# Patient Record
Sex: Male | Born: 1995 | Race: White | Hispanic: No | Marital: Single | Smoking: Never smoker
Health system: Southern US, Community
[De-identification: ages and names within clinical notes are randomized; demographics above are authoritative.]

## PROBLEM LIST (undated history)

## (undated) HISTORY — PX: CIRCUMCISION: SUR203

---
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
## 2016-01-06 DIAGNOSIS — R51 Headache: Secondary | ICD-10-CM | POA: Diagnosis not present

## 2016-01-06 DIAGNOSIS — G44221 Chronic tension-type headache, intractable: Secondary | ICD-10-CM | POA: Diagnosis not present

## 2016-01-08 ENCOUNTER — Other Ambulatory Visit: Payer: Self-pay | Admitting: Neurology

## 2016-01-08 DIAGNOSIS — R51 Headache: Principal | ICD-10-CM

## 2016-01-08 DIAGNOSIS — R519 Headache, unspecified: Secondary | ICD-10-CM

## 2016-01-14 ENCOUNTER — Ambulatory Visit
Admission: RE | Admit: 2016-01-14 | Discharge: 2016-01-14 | Disposition: A | Discharge status: Home / Self Care | Payer: BLUE CROSS/BLUE SHIELD | Source: Ambulatory Visit | Attending: Neurology | Admitting: Neurology

## 2016-01-14 DIAGNOSIS — R519 Headache, unspecified: Secondary | ICD-10-CM

## 2016-01-14 DIAGNOSIS — R51 Headache: Secondary | ICD-10-CM | POA: Diagnosis not present

## 2016-02-12 DIAGNOSIS — R51 Headache: Secondary | ICD-10-CM | POA: Diagnosis not present

## 2016-02-20 DIAGNOSIS — G44221 Chronic tension-type headache, intractable: Secondary | ICD-10-CM | POA: Diagnosis not present

## 2016-03-11 ENCOUNTER — Ambulatory Visit
Admission: RE | Admit: 2016-03-11 | Discharge: 2016-03-11 | Disposition: A | Discharge status: Home / Self Care | Payer: BLUE CROSS/BLUE SHIELD | Source: Ambulatory Visit | Attending: Neurology | Admitting: Neurology

## 2016-03-11 ENCOUNTER — Other Ambulatory Visit: Payer: Self-pay | Admitting: Neurology

## 2016-03-11 DIAGNOSIS — M542 Cervicalgia: Secondary | ICD-10-CM | POA: Diagnosis not present

## 2016-03-16 DIAGNOSIS — M542 Cervicalgia: Secondary | ICD-10-CM | POA: Diagnosis not present

## 2016-03-29 DIAGNOSIS — M542 Cervicalgia: Secondary | ICD-10-CM | POA: Diagnosis not present

## 2016-04-05 DIAGNOSIS — M542 Cervicalgia: Secondary | ICD-10-CM | POA: Diagnosis not present

## 2016-04-14 DIAGNOSIS — M542 Cervicalgia: Secondary | ICD-10-CM | POA: Diagnosis not present

## 2016-04-15 DIAGNOSIS — Z23 Encounter for immunization: Secondary | ICD-10-CM | POA: Diagnosis not present

## 2016-04-15 DIAGNOSIS — Z0000 Encounter for general adult medical examination without abnormal findings: Secondary | ICD-10-CM | POA: Diagnosis not present

## 2016-04-16 DIAGNOSIS — M542 Cervicalgia: Secondary | ICD-10-CM | POA: Diagnosis not present

## 2016-04-19 DIAGNOSIS — M542 Cervicalgia: Secondary | ICD-10-CM | POA: Diagnosis not present

## 2016-04-19 DIAGNOSIS — Z111 Encounter for screening for respiratory tuberculosis: Secondary | ICD-10-CM | POA: Diagnosis not present

## 2016-04-22 DIAGNOSIS — M542 Cervicalgia: Secondary | ICD-10-CM | POA: Diagnosis not present

## 2016-04-28 DIAGNOSIS — M542 Cervicalgia: Secondary | ICD-10-CM | POA: Diagnosis not present

## 2016-04-30 DIAGNOSIS — M542 Cervicalgia: Secondary | ICD-10-CM | POA: Diagnosis not present

## 2016-05-03 DIAGNOSIS — M542 Cervicalgia: Secondary | ICD-10-CM | POA: Diagnosis not present

## 2016-05-03 DIAGNOSIS — M9901 Segmental and somatic dysfunction of cervical region: Secondary | ICD-10-CM | POA: Diagnosis not present

## 2016-05-03 DIAGNOSIS — M9905 Segmental and somatic dysfunction of pelvic region: Secondary | ICD-10-CM | POA: Diagnosis not present

## 2016-05-03 DIAGNOSIS — M791 Myalgia: Secondary | ICD-10-CM | POA: Diagnosis not present

## 2016-05-03 DIAGNOSIS — M9902 Segmental and somatic dysfunction of thoracic region: Secondary | ICD-10-CM | POA: Diagnosis not present

## 2016-05-05 DIAGNOSIS — M9903 Segmental and somatic dysfunction of lumbar region: Secondary | ICD-10-CM | POA: Diagnosis not present

## 2016-05-05 DIAGNOSIS — M9905 Segmental and somatic dysfunction of pelvic region: Secondary | ICD-10-CM | POA: Diagnosis not present

## 2016-05-05 DIAGNOSIS — M791 Myalgia: Secondary | ICD-10-CM | POA: Diagnosis not present

## 2016-05-06 DIAGNOSIS — G44221 Chronic tension-type headache, intractable: Secondary | ICD-10-CM | POA: Diagnosis not present

## 2016-05-06 DIAGNOSIS — G4489 Other headache syndrome: Secondary | ICD-10-CM | POA: Diagnosis not present

## 2016-05-06 DIAGNOSIS — M542 Cervicalgia: Secondary | ICD-10-CM | POA: Diagnosis not present

## 2016-05-08 DIAGNOSIS — M9901 Segmental and somatic dysfunction of cervical region: Secondary | ICD-10-CM | POA: Diagnosis not present

## 2016-05-08 DIAGNOSIS — M9902 Segmental and somatic dysfunction of thoracic region: Secondary | ICD-10-CM | POA: Diagnosis not present

## 2016-05-08 DIAGNOSIS — M9905 Segmental and somatic dysfunction of pelvic region: Secondary | ICD-10-CM | POA: Diagnosis not present

## 2016-05-08 DIAGNOSIS — M9903 Segmental and somatic dysfunction of lumbar region: Secondary | ICD-10-CM | POA: Diagnosis not present

## 2016-05-10 DIAGNOSIS — M9902 Segmental and somatic dysfunction of thoracic region: Secondary | ICD-10-CM | POA: Diagnosis not present

## 2016-05-10 DIAGNOSIS — M9901 Segmental and somatic dysfunction of cervical region: Secondary | ICD-10-CM | POA: Diagnosis not present

## 2016-05-10 DIAGNOSIS — M9903 Segmental and somatic dysfunction of lumbar region: Secondary | ICD-10-CM | POA: Diagnosis not present

## 2016-05-11 DIAGNOSIS — M542 Cervicalgia: Secondary | ICD-10-CM | POA: Diagnosis not present

## 2016-05-12 DIAGNOSIS — M9905 Segmental and somatic dysfunction of pelvic region: Secondary | ICD-10-CM | POA: Diagnosis not present

## 2016-05-12 DIAGNOSIS — M9901 Segmental and somatic dysfunction of cervical region: Secondary | ICD-10-CM | POA: Diagnosis not present

## 2016-05-12 DIAGNOSIS — M9903 Segmental and somatic dysfunction of lumbar region: Secondary | ICD-10-CM | POA: Diagnosis not present

## 2016-05-12 DIAGNOSIS — M9902 Segmental and somatic dysfunction of thoracic region: Secondary | ICD-10-CM | POA: Diagnosis not present

## 2016-05-31 DIAGNOSIS — M9905 Segmental and somatic dysfunction of pelvic region: Secondary | ICD-10-CM | POA: Diagnosis not present

## 2016-05-31 DIAGNOSIS — M9901 Segmental and somatic dysfunction of cervical region: Secondary | ICD-10-CM | POA: Diagnosis not present

## 2016-05-31 DIAGNOSIS — M9902 Segmental and somatic dysfunction of thoracic region: Secondary | ICD-10-CM | POA: Diagnosis not present

## 2016-05-31 DIAGNOSIS — M9903 Segmental and somatic dysfunction of lumbar region: Secondary | ICD-10-CM | POA: Diagnosis not present

## 2016-06-02 DIAGNOSIS — M9901 Segmental and somatic dysfunction of cervical region: Secondary | ICD-10-CM | POA: Diagnosis not present

## 2016-06-02 DIAGNOSIS — M9902 Segmental and somatic dysfunction of thoracic region: Secondary | ICD-10-CM | POA: Diagnosis not present

## 2016-06-02 DIAGNOSIS — M9903 Segmental and somatic dysfunction of lumbar region: Secondary | ICD-10-CM | POA: Diagnosis not present

## 2016-06-02 DIAGNOSIS — M9905 Segmental and somatic dysfunction of pelvic region: Secondary | ICD-10-CM | POA: Diagnosis not present

## 2016-06-16 DIAGNOSIS — M791 Myalgia: Secondary | ICD-10-CM | POA: Diagnosis not present

## 2016-06-16 DIAGNOSIS — M545 Low back pain: Secondary | ICD-10-CM | POA: Diagnosis not present

## 2016-06-16 DIAGNOSIS — M542 Cervicalgia: Secondary | ICD-10-CM | POA: Diagnosis not present

## 2016-06-23 DIAGNOSIS — M791 Myalgia: Secondary | ICD-10-CM | POA: Diagnosis not present

## 2016-06-23 DIAGNOSIS — M545 Low back pain: Secondary | ICD-10-CM | POA: Diagnosis not present

## 2016-06-23 DIAGNOSIS — M542 Cervicalgia: Secondary | ICD-10-CM | POA: Diagnosis not present

## 2016-06-25 DIAGNOSIS — M791 Myalgia: Secondary | ICD-10-CM | POA: Diagnosis not present

## 2016-06-25 DIAGNOSIS — M542 Cervicalgia: Secondary | ICD-10-CM | POA: Diagnosis not present

## 2016-06-25 DIAGNOSIS — M545 Low back pain: Secondary | ICD-10-CM | POA: Diagnosis not present

## 2016-07-02 DIAGNOSIS — M545 Low back pain: Secondary | ICD-10-CM | POA: Diagnosis not present

## 2016-07-02 DIAGNOSIS — M791 Myalgia: Secondary | ICD-10-CM | POA: Diagnosis not present

## 2016-07-02 DIAGNOSIS — M542 Cervicalgia: Secondary | ICD-10-CM | POA: Diagnosis not present

## 2016-07-14 DIAGNOSIS — M791 Myalgia: Secondary | ICD-10-CM | POA: Diagnosis not present

## 2016-07-14 DIAGNOSIS — M542 Cervicalgia: Secondary | ICD-10-CM | POA: Diagnosis not present

## 2016-07-14 DIAGNOSIS — M545 Low back pain: Secondary | ICD-10-CM | POA: Diagnosis not present

## 2016-08-05 DIAGNOSIS — M542 Cervicalgia: Secondary | ICD-10-CM | POA: Diagnosis not present

## 2016-08-05 DIAGNOSIS — M791 Myalgia: Secondary | ICD-10-CM | POA: Diagnosis not present

## 2016-08-05 DIAGNOSIS — M545 Low back pain: Secondary | ICD-10-CM | POA: Diagnosis not present

## 2016-08-18 DIAGNOSIS — M545 Low back pain: Secondary | ICD-10-CM | POA: Diagnosis not present

## 2016-08-18 DIAGNOSIS — M542 Cervicalgia: Secondary | ICD-10-CM | POA: Diagnosis not present

## 2016-08-18 DIAGNOSIS — M791 Myalgia: Secondary | ICD-10-CM | POA: Diagnosis not present

## 2016-08-23 DIAGNOSIS — M542 Cervicalgia: Secondary | ICD-10-CM | POA: Diagnosis not present

## 2016-08-23 DIAGNOSIS — M791 Myalgia: Secondary | ICD-10-CM | POA: Diagnosis not present

## 2016-08-23 DIAGNOSIS — M545 Low back pain: Secondary | ICD-10-CM | POA: Diagnosis not present

## 2016-08-30 DIAGNOSIS — M545 Low back pain: Secondary | ICD-10-CM | POA: Diagnosis not present

## 2016-08-30 DIAGNOSIS — M542 Cervicalgia: Secondary | ICD-10-CM | POA: Diagnosis not present

## 2016-08-30 DIAGNOSIS — M791 Myalgia: Secondary | ICD-10-CM | POA: Diagnosis not present

## 2016-10-25 DIAGNOSIS — M791 Myalgia: Secondary | ICD-10-CM | POA: Diagnosis not present

## 2016-10-25 DIAGNOSIS — S060X0A Concussion without loss of consciousness, initial encounter: Secondary | ICD-10-CM | POA: Diagnosis not present

## 2016-11-01 DIAGNOSIS — S060X0D Concussion without loss of consciousness, subsequent encounter: Secondary | ICD-10-CM | POA: Diagnosis not present

## 2017-03-01 DIAGNOSIS — S40862A Insect bite (nonvenomous) of left upper arm, initial encounter: Secondary | ICD-10-CM | POA: Diagnosis not present

## 2017-03-01 DIAGNOSIS — R5383 Other fatigue: Secondary | ICD-10-CM | POA: Diagnosis not present

## 2017-03-01 DIAGNOSIS — J301 Allergic rhinitis due to pollen: Secondary | ICD-10-CM | POA: Diagnosis not present

## 2017-03-01 DIAGNOSIS — H5213 Myopia, bilateral: Secondary | ICD-10-CM | POA: Diagnosis not present

## 2017-03-21 IMAGING — CR DG CERVICAL SPINE COMPLETE 4+V
6 series · 6 of 6 positions shown · non-contrast
Comparison: MRI 01/14/2016.  CT 10/18/2014.

CLINICAL DATA: Neck pain.  Prior injury.

EXAM:
CERVICAL SPINE - COMPLETE 4+ VIEW

[w cervical spine lat]
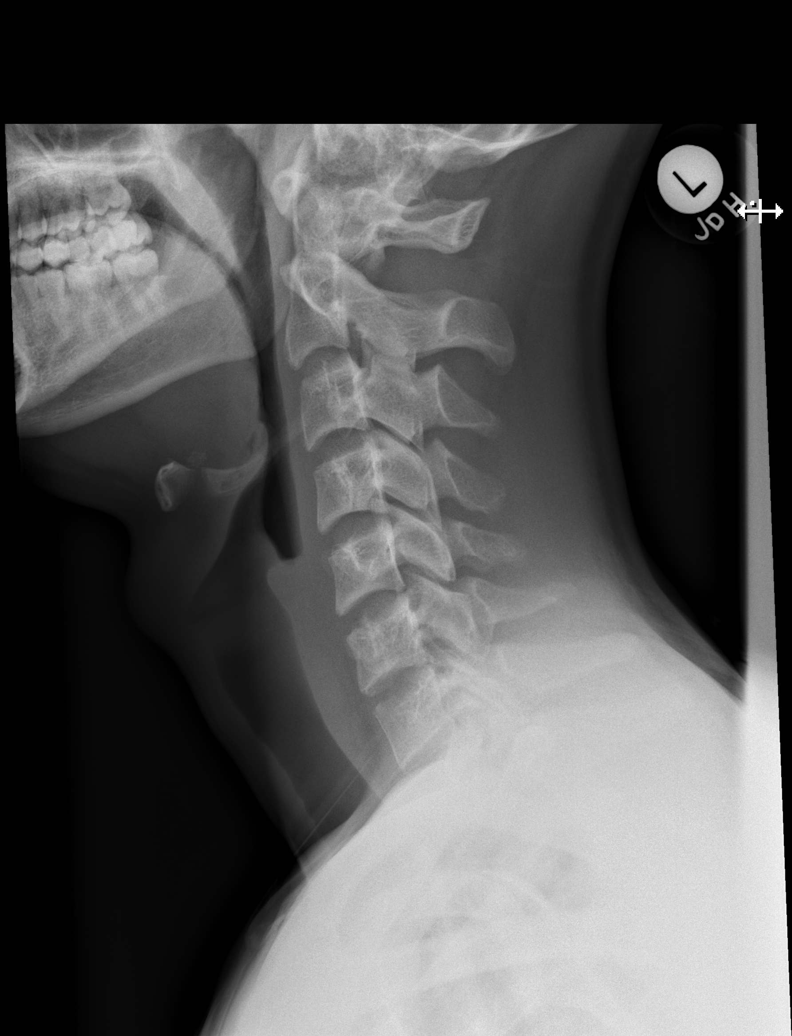

[w cervical spine flexion (1 of 2)]
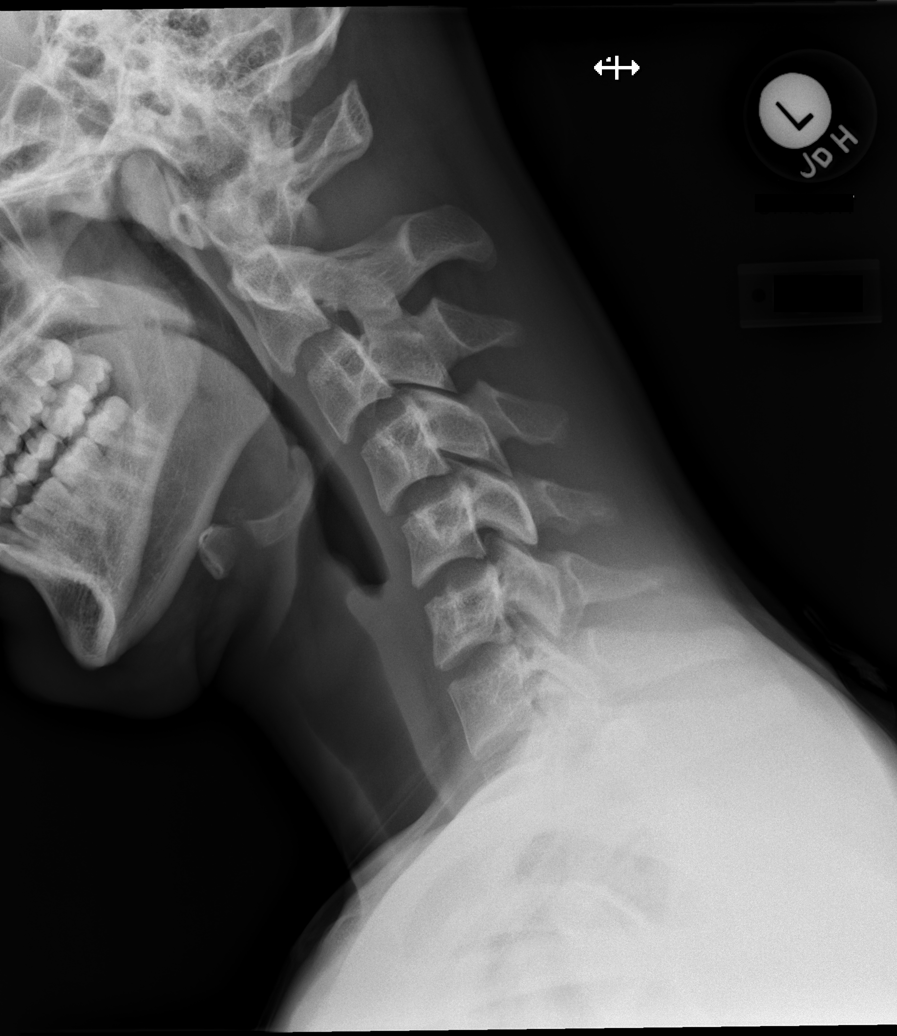

[w cervical spine flexion (2 of 2)]
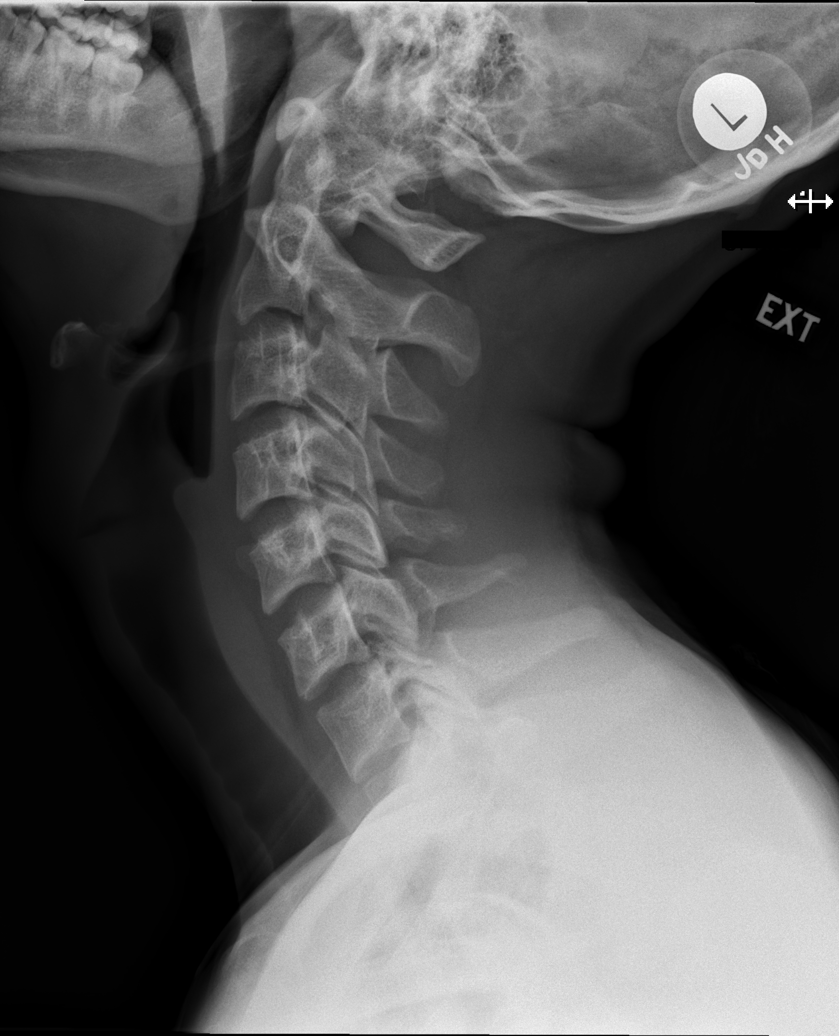

[w cervical swimmers]
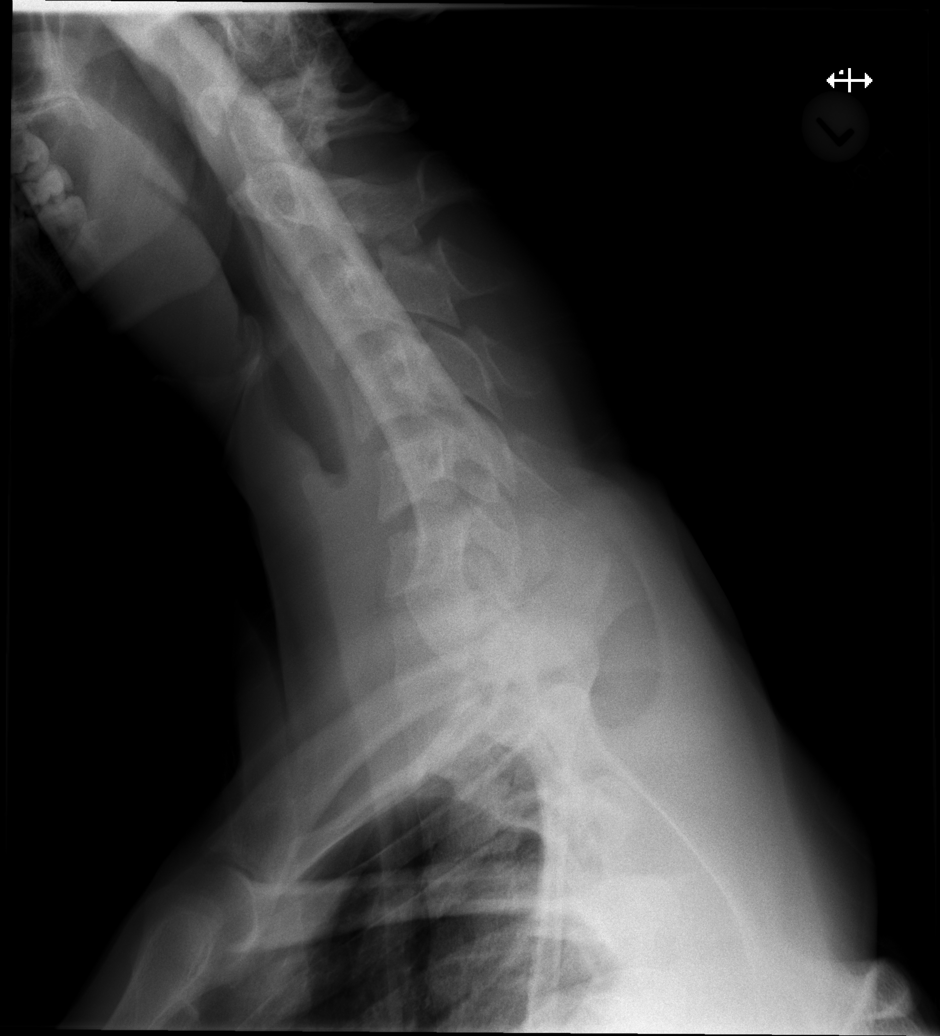

[w cervical spine ap]
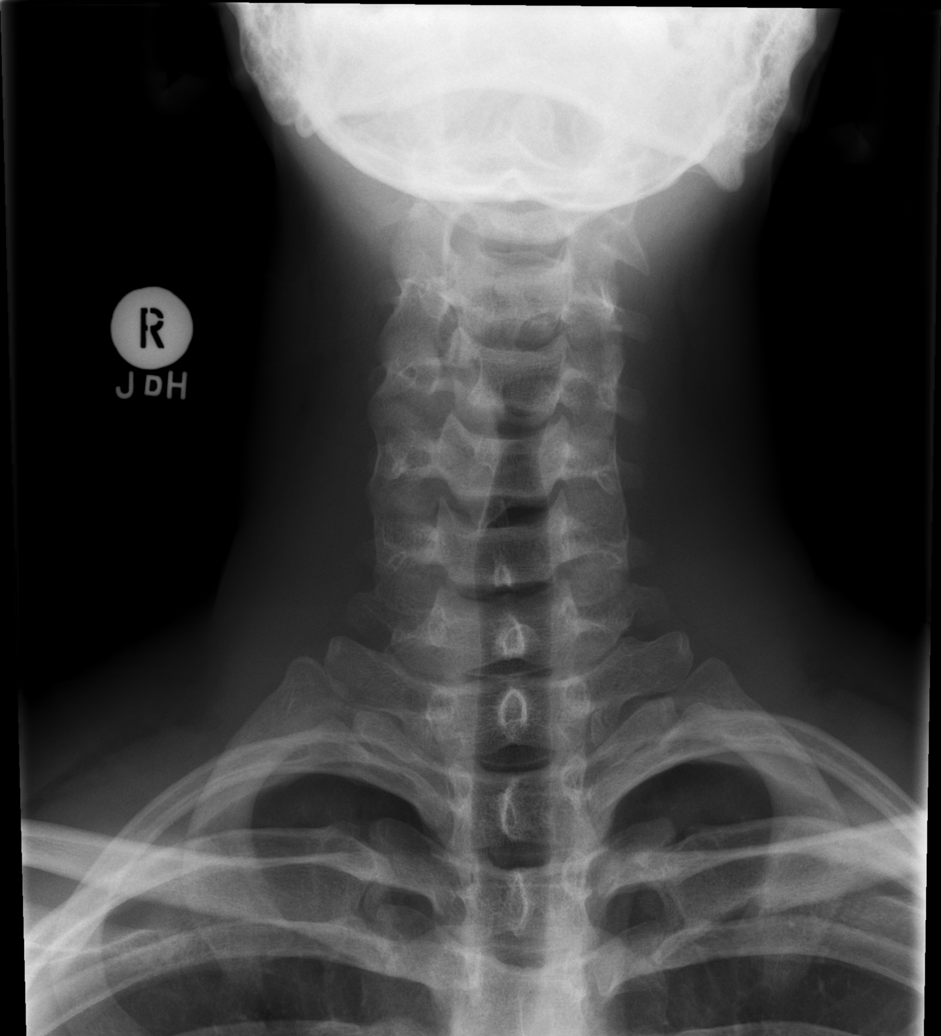

[t cervical spine odontoid]
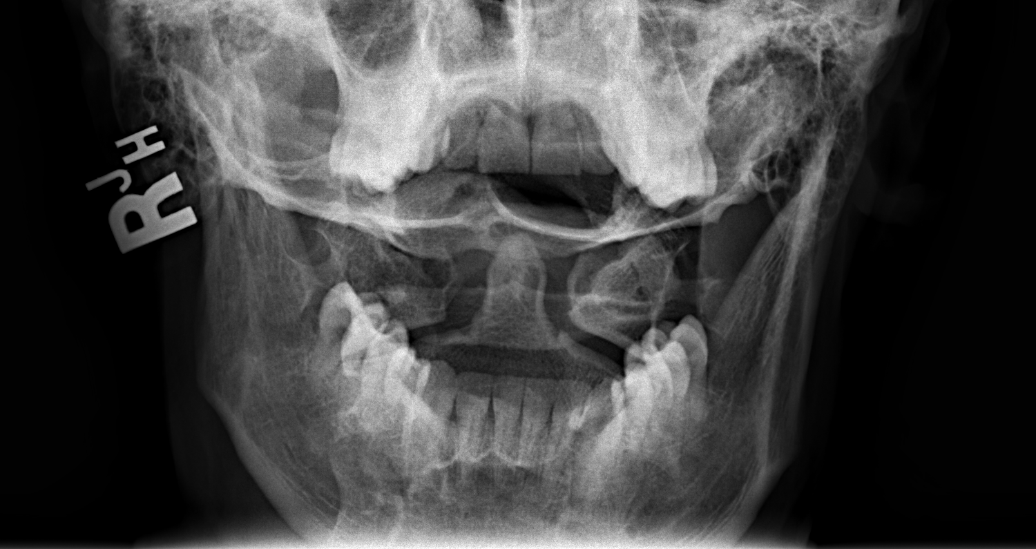

[6 of 6 positions shown; findings below may reference images not displayed]

FINDINGS: No evidence of fracture or dislocation. 3 mm anterolisthesis C4 on
C5 noted on flexion views. Ligamentous injury cannot be excluded. 2
mm anterolisthesis of C2 on C3 and C3 on C4 noted on flexion views,
this may be physiologic, however ligamentous injury cannot be
excluded.
IMPRESSION: 1. No evidence of fracture or dislocation.

2. 3 mm anterolisthesis C4 on C5 noted on flexion views. Ligamentous
injury cannot be excluded. 2 mm anterolisthesis of C2 on C3 and C3
on C4 on flexion views noted. This may be physiologic, however
ligamentous injury cannot be excluded. MRI of the cervical spine may
prove useful for further evaluation.

## 2017-05-06 DIAGNOSIS — Z23 Encounter for immunization: Secondary | ICD-10-CM | POA: Diagnosis not present

## 2017-07-11 DIAGNOSIS — S41111A Laceration without foreign body of right upper arm, initial encounter: Secondary | ICD-10-CM | POA: Diagnosis not present

## 2017-09-22 DIAGNOSIS — J069 Acute upper respiratory infection, unspecified: Secondary | ICD-10-CM | POA: Diagnosis not present

## 2017-10-03 DIAGNOSIS — S43085A Other dislocation of left shoulder joint, initial encounter: Secondary | ICD-10-CM | POA: Diagnosis not present

## 2017-10-05 DIAGNOSIS — M25512 Pain in left shoulder: Secondary | ICD-10-CM | POA: Diagnosis not present

## 2017-10-05 DIAGNOSIS — M75112 Incomplete rotator cuff tear or rupture of left shoulder, not specified as traumatic: Secondary | ICD-10-CM | POA: Diagnosis not present

## 2017-10-05 DIAGNOSIS — M7582 Other shoulder lesions, left shoulder: Secondary | ICD-10-CM | POA: Diagnosis not present

## 2017-10-07 DIAGNOSIS — S43492A Other sprain of left shoulder joint, initial encounter: Secondary | ICD-10-CM | POA: Diagnosis not present

## 2017-10-07 DIAGNOSIS — S43085D Other dislocation of left shoulder joint, subsequent encounter: Secondary | ICD-10-CM | POA: Diagnosis not present

## 2018-01-20 DIAGNOSIS — M25512 Pain in left shoulder: Secondary | ICD-10-CM | POA: Diagnosis not present

## 2018-02-02 DIAGNOSIS — M24112 Other articular cartilage disorders, left shoulder: Secondary | ICD-10-CM | POA: Diagnosis not present

## 2018-02-02 DIAGNOSIS — M25312 Other instability, left shoulder: Secondary | ICD-10-CM | POA: Diagnosis not present

## 2018-02-02 DIAGNOSIS — G8918 Other acute postprocedural pain: Secondary | ICD-10-CM | POA: Diagnosis not present

## 2018-02-02 DIAGNOSIS — S43005A Unspecified dislocation of left shoulder joint, initial encounter: Secondary | ICD-10-CM | POA: Diagnosis not present

## 2018-02-15 DIAGNOSIS — M24112 Other articular cartilage disorders, left shoulder: Secondary | ICD-10-CM | POA: Diagnosis not present

## 2018-03-06 DIAGNOSIS — H5213 Myopia, bilateral: Secondary | ICD-10-CM | POA: Diagnosis not present

## 2018-03-17 DIAGNOSIS — M25511 Pain in right shoulder: Secondary | ICD-10-CM | POA: Diagnosis not present

## 2018-03-20 DIAGNOSIS — M25512 Pain in left shoulder: Secondary | ICD-10-CM | POA: Diagnosis not present

## 2018-03-20 DIAGNOSIS — S43005D Unspecified dislocation of left shoulder joint, subsequent encounter: Secondary | ICD-10-CM | POA: Diagnosis not present

## 2018-03-20 DIAGNOSIS — M6281 Muscle weakness (generalized): Secondary | ICD-10-CM | POA: Diagnosis not present

## 2018-03-20 DIAGNOSIS — M25612 Stiffness of left shoulder, not elsewhere classified: Secondary | ICD-10-CM | POA: Diagnosis not present

## 2018-03-27 DIAGNOSIS — M25512 Pain in left shoulder: Secondary | ICD-10-CM | POA: Diagnosis not present

## 2018-03-27 DIAGNOSIS — S43005D Unspecified dislocation of left shoulder joint, subsequent encounter: Secondary | ICD-10-CM | POA: Diagnosis not present

## 2018-03-27 DIAGNOSIS — M25612 Stiffness of left shoulder, not elsewhere classified: Secondary | ICD-10-CM | POA: Diagnosis not present

## 2018-03-27 DIAGNOSIS — M6281 Muscle weakness (generalized): Secondary | ICD-10-CM | POA: Diagnosis not present

## 2018-03-29 DIAGNOSIS — M6281 Muscle weakness (generalized): Secondary | ICD-10-CM | POA: Diagnosis not present

## 2018-03-29 DIAGNOSIS — M25512 Pain in left shoulder: Secondary | ICD-10-CM | POA: Diagnosis not present

## 2018-03-29 DIAGNOSIS — M25612 Stiffness of left shoulder, not elsewhere classified: Secondary | ICD-10-CM | POA: Diagnosis not present

## 2018-03-29 DIAGNOSIS — S43005D Unspecified dislocation of left shoulder joint, subsequent encounter: Secondary | ICD-10-CM | POA: Diagnosis not present

## 2018-04-03 DIAGNOSIS — M25512 Pain in left shoulder: Secondary | ICD-10-CM | POA: Diagnosis not present

## 2018-04-03 DIAGNOSIS — M25612 Stiffness of left shoulder, not elsewhere classified: Secondary | ICD-10-CM | POA: Diagnosis not present

## 2018-04-03 DIAGNOSIS — M6281 Muscle weakness (generalized): Secondary | ICD-10-CM | POA: Diagnosis not present

## 2018-04-03 DIAGNOSIS — S43005D Unspecified dislocation of left shoulder joint, subsequent encounter: Secondary | ICD-10-CM | POA: Diagnosis not present

## 2018-04-05 DIAGNOSIS — S43005D Unspecified dislocation of left shoulder joint, subsequent encounter: Secondary | ICD-10-CM | POA: Diagnosis not present

## 2018-04-05 DIAGNOSIS — M25612 Stiffness of left shoulder, not elsewhere classified: Secondary | ICD-10-CM | POA: Diagnosis not present

## 2018-04-05 DIAGNOSIS — M25512 Pain in left shoulder: Secondary | ICD-10-CM | POA: Diagnosis not present

## 2018-04-05 DIAGNOSIS — M6281 Muscle weakness (generalized): Secondary | ICD-10-CM | POA: Diagnosis not present

## 2018-04-10 DIAGNOSIS — M25612 Stiffness of left shoulder, not elsewhere classified: Secondary | ICD-10-CM | POA: Diagnosis not present

## 2018-04-10 DIAGNOSIS — S43005D Unspecified dislocation of left shoulder joint, subsequent encounter: Secondary | ICD-10-CM | POA: Diagnosis not present

## 2018-04-10 DIAGNOSIS — M6281 Muscle weakness (generalized): Secondary | ICD-10-CM | POA: Diagnosis not present

## 2018-04-10 DIAGNOSIS — M25512 Pain in left shoulder: Secondary | ICD-10-CM | POA: Diagnosis not present

## 2018-04-12 DIAGNOSIS — M25612 Stiffness of left shoulder, not elsewhere classified: Secondary | ICD-10-CM | POA: Diagnosis not present

## 2018-04-12 DIAGNOSIS — S43005D Unspecified dislocation of left shoulder joint, subsequent encounter: Secondary | ICD-10-CM | POA: Diagnosis not present

## 2018-04-12 DIAGNOSIS — M25512 Pain in left shoulder: Secondary | ICD-10-CM | POA: Diagnosis not present

## 2018-04-12 DIAGNOSIS — M6281 Muscle weakness (generalized): Secondary | ICD-10-CM | POA: Diagnosis not present

## 2018-04-17 DIAGNOSIS — S43005D Unspecified dislocation of left shoulder joint, subsequent encounter: Secondary | ICD-10-CM | POA: Diagnosis not present

## 2018-04-17 DIAGNOSIS — M25612 Stiffness of left shoulder, not elsewhere classified: Secondary | ICD-10-CM | POA: Diagnosis not present

## 2018-04-17 DIAGNOSIS — M25512 Pain in left shoulder: Secondary | ICD-10-CM | POA: Diagnosis not present

## 2018-04-17 DIAGNOSIS — M6281 Muscle weakness (generalized): Secondary | ICD-10-CM | POA: Diagnosis not present

## 2018-04-19 DIAGNOSIS — M25512 Pain in left shoulder: Secondary | ICD-10-CM | POA: Diagnosis not present

## 2018-04-19 DIAGNOSIS — M6281 Muscle weakness (generalized): Secondary | ICD-10-CM | POA: Diagnosis not present

## 2018-04-19 DIAGNOSIS — S43005D Unspecified dislocation of left shoulder joint, subsequent encounter: Secondary | ICD-10-CM | POA: Diagnosis not present

## 2018-04-19 DIAGNOSIS — M25612 Stiffness of left shoulder, not elsewhere classified: Secondary | ICD-10-CM | POA: Diagnosis not present

## 2018-04-21 DIAGNOSIS — M25511 Pain in right shoulder: Secondary | ICD-10-CM | POA: Diagnosis not present

## 2018-04-26 DIAGNOSIS — M25512 Pain in left shoulder: Secondary | ICD-10-CM | POA: Diagnosis not present

## 2018-04-26 DIAGNOSIS — M6281 Muscle weakness (generalized): Secondary | ICD-10-CM | POA: Diagnosis not present

## 2018-04-26 DIAGNOSIS — S43005D Unspecified dislocation of left shoulder joint, subsequent encounter: Secondary | ICD-10-CM | POA: Diagnosis not present

## 2018-04-26 DIAGNOSIS — M25612 Stiffness of left shoulder, not elsewhere classified: Secondary | ICD-10-CM | POA: Diagnosis not present

## 2018-04-26 DIAGNOSIS — M25511 Pain in right shoulder: Secondary | ICD-10-CM | POA: Diagnosis not present

## 2018-04-28 DIAGNOSIS — M25612 Stiffness of left shoulder, not elsewhere classified: Secondary | ICD-10-CM | POA: Diagnosis not present

## 2018-04-28 DIAGNOSIS — M25511 Pain in right shoulder: Secondary | ICD-10-CM | POA: Diagnosis not present

## 2018-04-28 DIAGNOSIS — M25512 Pain in left shoulder: Secondary | ICD-10-CM | POA: Diagnosis not present

## 2018-04-28 DIAGNOSIS — S43005D Unspecified dislocation of left shoulder joint, subsequent encounter: Secondary | ICD-10-CM | POA: Diagnosis not present

## 2018-04-28 DIAGNOSIS — M6281 Muscle weakness (generalized): Secondary | ICD-10-CM | POA: Diagnosis not present

## 2018-05-05 DIAGNOSIS — M25512 Pain in left shoulder: Secondary | ICD-10-CM | POA: Diagnosis not present

## 2018-05-05 DIAGNOSIS — M6281 Muscle weakness (generalized): Secondary | ICD-10-CM | POA: Diagnosis not present

## 2018-05-05 DIAGNOSIS — M25612 Stiffness of left shoulder, not elsewhere classified: Secondary | ICD-10-CM | POA: Diagnosis not present

## 2018-05-05 DIAGNOSIS — S43005D Unspecified dislocation of left shoulder joint, subsequent encounter: Secondary | ICD-10-CM | POA: Diagnosis not present

## 2019-02-22 DIAGNOSIS — M7542 Impingement syndrome of left shoulder: Secondary | ICD-10-CM | POA: Diagnosis not present

## 2019-02-22 DIAGNOSIS — M9901 Segmental and somatic dysfunction of cervical region: Secondary | ICD-10-CM | POA: Diagnosis not present

## 2019-02-22 DIAGNOSIS — M9902 Segmental and somatic dysfunction of thoracic region: Secondary | ICD-10-CM | POA: Diagnosis not present

## 2019-02-22 DIAGNOSIS — M542 Cervicalgia: Secondary | ICD-10-CM | POA: Diagnosis not present

## 2019-02-26 DIAGNOSIS — M542 Cervicalgia: Secondary | ICD-10-CM | POA: Diagnosis not present

## 2019-02-26 DIAGNOSIS — M9901 Segmental and somatic dysfunction of cervical region: Secondary | ICD-10-CM | POA: Diagnosis not present

## 2019-02-26 DIAGNOSIS — M7542 Impingement syndrome of left shoulder: Secondary | ICD-10-CM | POA: Diagnosis not present

## 2019-02-26 DIAGNOSIS — M9902 Segmental and somatic dysfunction of thoracic region: Secondary | ICD-10-CM | POA: Diagnosis not present

## 2019-02-28 DIAGNOSIS — M542 Cervicalgia: Secondary | ICD-10-CM | POA: Diagnosis not present

## 2019-02-28 DIAGNOSIS — M7542 Impingement syndrome of left shoulder: Secondary | ICD-10-CM | POA: Diagnosis not present

## 2019-02-28 DIAGNOSIS — M9901 Segmental and somatic dysfunction of cervical region: Secondary | ICD-10-CM | POA: Diagnosis not present

## 2019-02-28 DIAGNOSIS — M9902 Segmental and somatic dysfunction of thoracic region: Secondary | ICD-10-CM | POA: Diagnosis not present

## 2019-02-28 DIAGNOSIS — Z713 Dietary counseling and surveillance: Secondary | ICD-10-CM | POA: Diagnosis not present

## 2019-03-05 DIAGNOSIS — M7542 Impingement syndrome of left shoulder: Secondary | ICD-10-CM | POA: Diagnosis not present

## 2019-03-05 DIAGNOSIS — Z713 Dietary counseling and surveillance: Secondary | ICD-10-CM | POA: Diagnosis not present

## 2019-03-05 DIAGNOSIS — M542 Cervicalgia: Secondary | ICD-10-CM | POA: Diagnosis not present

## 2019-03-05 DIAGNOSIS — M9902 Segmental and somatic dysfunction of thoracic region: Secondary | ICD-10-CM | POA: Diagnosis not present

## 2019-03-05 DIAGNOSIS — M9901 Segmental and somatic dysfunction of cervical region: Secondary | ICD-10-CM | POA: Diagnosis not present

## 2019-03-08 DIAGNOSIS — M7542 Impingement syndrome of left shoulder: Secondary | ICD-10-CM | POA: Diagnosis not present

## 2019-03-08 DIAGNOSIS — M9901 Segmental and somatic dysfunction of cervical region: Secondary | ICD-10-CM | POA: Diagnosis not present

## 2019-03-08 DIAGNOSIS — M9902 Segmental and somatic dysfunction of thoracic region: Secondary | ICD-10-CM | POA: Diagnosis not present

## 2019-03-08 DIAGNOSIS — M542 Cervicalgia: Secondary | ICD-10-CM | POA: Diagnosis not present

## 2019-03-14 DIAGNOSIS — M9902 Segmental and somatic dysfunction of thoracic region: Secondary | ICD-10-CM | POA: Diagnosis not present

## 2019-03-14 DIAGNOSIS — M542 Cervicalgia: Secondary | ICD-10-CM | POA: Diagnosis not present

## 2019-03-14 DIAGNOSIS — M9901 Segmental and somatic dysfunction of cervical region: Secondary | ICD-10-CM | POA: Diagnosis not present

## 2019-03-14 DIAGNOSIS — M7542 Impingement syndrome of left shoulder: Secondary | ICD-10-CM | POA: Diagnosis not present

## 2019-03-26 DIAGNOSIS — M9901 Segmental and somatic dysfunction of cervical region: Secondary | ICD-10-CM | POA: Diagnosis not present

## 2019-03-26 DIAGNOSIS — M7542 Impingement syndrome of left shoulder: Secondary | ICD-10-CM | POA: Diagnosis not present

## 2019-03-26 DIAGNOSIS — M542 Cervicalgia: Secondary | ICD-10-CM | POA: Diagnosis not present

## 2019-03-26 DIAGNOSIS — M9902 Segmental and somatic dysfunction of thoracic region: Secondary | ICD-10-CM | POA: Diagnosis not present

## 2019-03-27 DIAGNOSIS — M25511 Pain in right shoulder: Secondary | ICD-10-CM | POA: Diagnosis not present

## 2019-04-17 DIAGNOSIS — M7542 Impingement syndrome of left shoulder: Secondary | ICD-10-CM | POA: Diagnosis not present

## 2019-04-17 DIAGNOSIS — M542 Cervicalgia: Secondary | ICD-10-CM | POA: Diagnosis not present

## 2019-04-17 DIAGNOSIS — M9902 Segmental and somatic dysfunction of thoracic region: Secondary | ICD-10-CM | POA: Diagnosis not present

## 2019-04-17 DIAGNOSIS — M9901 Segmental and somatic dysfunction of cervical region: Secondary | ICD-10-CM | POA: Diagnosis not present

## 2019-04-23 DIAGNOSIS — M9902 Segmental and somatic dysfunction of thoracic region: Secondary | ICD-10-CM | POA: Diagnosis not present

## 2019-04-23 DIAGNOSIS — M542 Cervicalgia: Secondary | ICD-10-CM | POA: Diagnosis not present

## 2019-04-23 DIAGNOSIS — M7542 Impingement syndrome of left shoulder: Secondary | ICD-10-CM | POA: Diagnosis not present

## 2019-04-23 DIAGNOSIS — M9901 Segmental and somatic dysfunction of cervical region: Secondary | ICD-10-CM | POA: Diagnosis not present

## 2019-08-27 DIAGNOSIS — Z298 Encounter for other specified prophylactic measures: Secondary | ICD-10-CM | POA: Diagnosis not present

## 2019-08-27 DIAGNOSIS — Z23 Encounter for immunization: Secondary | ICD-10-CM | POA: Diagnosis not present

## 2019-09-04 DIAGNOSIS — Z1159 Encounter for screening for other viral diseases: Secondary | ICD-10-CM | POA: Diagnosis not present

## 2019-10-07 DIAGNOSIS — R1013 Epigastric pain: Secondary | ICD-10-CM | POA: Diagnosis not present

## 2020-01-28 DIAGNOSIS — Z23 Encounter for immunization: Secondary | ICD-10-CM | POA: Diagnosis not present

## 2020-01-28 DIAGNOSIS — Z7189 Other specified counseling: Secondary | ICD-10-CM | POA: Diagnosis not present

## 2020-02-01 DIAGNOSIS — Z1152 Encounter for screening for COVID-19: Secondary | ICD-10-CM | POA: Diagnosis not present

## 2020-08-20 DIAGNOSIS — H10413 Chronic giant papillary conjunctivitis, bilateral: Secondary | ICD-10-CM | POA: Diagnosis not present

## 2020-08-20 DIAGNOSIS — H5213 Myopia, bilateral: Secondary | ICD-10-CM | POA: Diagnosis not present

## 2021-01-31 DIAGNOSIS — Z20822 Contact with and (suspected) exposure to covid-19: Secondary | ICD-10-CM | POA: Diagnosis not present
